# Patient Record
Sex: Male | Born: 1955 | Race: Black or African American | Hispanic: No | Marital: Married | Smoking: Never smoker
Health system: Southern US, Community
[De-identification: ages and names within clinical notes are randomized; demographics above are authoritative.]

## PROBLEM LIST (undated history)

## (undated) DIAGNOSIS — I219 Acute myocardial infarction, unspecified: Secondary | ICD-10-CM

## (undated) DIAGNOSIS — I10 Essential (primary) hypertension: Secondary | ICD-10-CM

## (undated) DIAGNOSIS — E119 Type 2 diabetes mellitus without complications: Secondary | ICD-10-CM

## (undated) HISTORY — PX: EYE SURGERY: SHX253

## (undated) HISTORY — PX: KIDNEY DONATION: SHX685

---

## 2016-03-26 ENCOUNTER — Encounter (HOSPITAL_COMMUNITY): Payer: Self-pay

## 2016-03-26 ENCOUNTER — Emergency Department (HOSPITAL_COMMUNITY)
Admission: EM | Admit: 2016-03-26 | Discharge: 2016-03-26 | Disposition: A | Discharge status: Home / Self Care | Payer: Managed Care, Other (non HMO) | Attending: Emergency Medicine | Admitting: Emergency Medicine

## 2016-03-26 ENCOUNTER — Emergency Department (HOSPITAL_COMMUNITY): Payer: Managed Care, Other (non HMO)

## 2016-03-26 DIAGNOSIS — R29810 Facial weakness: Secondary | ICD-10-CM | POA: Insufficient documentation

## 2016-03-26 DIAGNOSIS — E11649 Type 2 diabetes mellitus with hypoglycemia without coma: Secondary | ICD-10-CM | POA: Diagnosis not present

## 2016-03-26 DIAGNOSIS — Z794 Long term (current) use of insulin: Secondary | ICD-10-CM | POA: Insufficient documentation

## 2016-03-26 DIAGNOSIS — I10 Essential (primary) hypertension: Secondary | ICD-10-CM | POA: Insufficient documentation

## 2016-03-26 DIAGNOSIS — E162 Hypoglycemia, unspecified: Secondary | ICD-10-CM

## 2016-03-26 HISTORY — DX: Essential (primary) hypertension: I10

## 2016-03-26 HISTORY — DX: Type 2 diabetes mellitus without complications: E11.9

## 2016-03-26 HISTORY — DX: Acute myocardial infarction, unspecified: I21.9

## 2016-03-26 LAB — CBC WITH DIFFERENTIAL/PLATELET
Basophils Absolute: 0 10*3/uL (ref 0.0–0.1)
Basophils Relative: 0 %
EOS ABS: 0 10*3/uL (ref 0.0–0.7)
EOS PCT: 0 %
HCT: 37.6 % — ABNORMAL LOW (ref 39.0–52.0)
HEMOGLOBIN: 12.5 g/dL — AB (ref 13.0–17.0)
LYMPHS ABS: 0.9 10*3/uL (ref 0.7–4.0)
Lymphocytes Relative: 13 %
MCH: 30.8 pg (ref 26.0–34.0)
MCHC: 33.2 g/dL (ref 30.0–36.0)
MCV: 92.6 fL (ref 78.0–100.0)
MONOS PCT: 5 %
Monocytes Absolute: 0.3 10*3/uL (ref 0.1–1.0)
NEUTROS PCT: 82 %
Neutro Abs: 5.3 10*3/uL (ref 1.7–7.7)
Platelets: 175 10*3/uL (ref 150–400)
RBC: 4.06 MIL/uL — ABNORMAL LOW (ref 4.22–5.81)
RDW: 12.4 % (ref 11.5–15.5)
WBC: 6.5 10*3/uL (ref 4.0–10.5)

## 2016-03-26 LAB — COMPREHENSIVE METABOLIC PANEL
ALT: 30 U/L (ref 17–63)
AST: 25 U/L (ref 15–41)
Albumin: 3.1 g/dL — ABNORMAL LOW (ref 3.5–5.0)
Alkaline Phosphatase: 93 U/L (ref 38–126)
Anion gap: 4 — ABNORMAL LOW (ref 5–15)
BUN: 10 mg/dL (ref 6–20)
CO2: 26 mmol/L (ref 22–32)
CREATININE: 1.06 mg/dL (ref 0.61–1.24)
Calcium: 8.3 mg/dL — ABNORMAL LOW (ref 8.9–10.3)
Chloride: 108 mmol/L (ref 101–111)
GFR calc Af Amer: >60 mL/min (ref >60)
GFR calc non Af Amer: >60 mL/min (ref >60)
Glucose, Bld: 115 mg/dL — ABNORMAL HIGH (ref 65–99)
Potassium: 3.7 mmol/L (ref 3.5–5.1)
SODIUM: 138 mmol/L (ref 135–145)
Total Bilirubin: 0.6 mg/dL (ref 0.3–1.2)
Total Protein: 6.1 g/dL — ABNORMAL LOW (ref 6.5–8.1)

## 2016-03-26 LAB — URINALYSIS, ROUTINE W REFLEX MICROSCOPIC
BACTERIA UA: NONE SEEN
Bilirubin Urine: NEGATIVE
Hgb urine dipstick: NEGATIVE
Ketones, ur: NEGATIVE mg/dL
Leukocytes, UA: NEGATIVE
Nitrite: NEGATIVE
Protein, ur: NEGATIVE mg/dL
Specific Gravity, Urine: 1.016 (ref 1.005–1.030)
pH: 5 (ref 5.0–8.0)

## 2016-03-26 LAB — CBG MONITORING, ED
GLUCOSE-CAPILLARY: 89 mg/dL (ref 65–99)
GLUCOSE-CAPILLARY: 258 mg/dL — AB (ref 65–99)
GLUCOSE-CAPILLARY: 358 mg/dL — AB (ref 65–99)

## 2016-03-26 NOTE — ED Notes (Signed)
Pt stable, understands discharge instructions, and reasons for return.   

## 2016-03-26 NOTE — ED Notes (Signed)
Patient transported to X-ray 

## 2016-03-26 NOTE — ED Provider Notes (Signed)
[facility]-EMERGENCY DEPT Provider Note   CSN: [id number] Arrival date & time: 03/26/16 1928     History    Chief Complaint  Patient presents with  . Hypoglycemia  . Facial Droop     HPI Stephen Braun is a 60 y.o. male. 60yo M w/ IDDM, HTN who p/w hypoglycemia and facial droop. Per EMS, pt was found altered in his car at a gas station. He was found to have a blood glucose of 31 and given an amp of D50. He initially had left-sided facial droop. The patient currently denies any complaints. He notes that over the past 2 weeks after his endocrinologist increased his basal insulin, he has had frequent episodes of hypoglycemia. He denies any major changes in his diet. He denies any fevers, vomiting, diarrhea, chest pain, shortness of breath, extremity numbness/weakness, or recent illness. Currently he feels fine.  Past Medical History:  Diagnosis Date  . Diabetes mellitus without complication (HCC)   . Heart attack   . Hypertension      There are no active problems to display for this patient.   Past Surgical History:  Procedure Laterality Date  . EYE SURGERY    . KIDNEY DONATION          Home Medications    Prior to Admission medications   Medication Sig Start Date End Date Taking? Authorizing Provider  Insulin Human (INSULIN PUMP) SOLN Inject into the skin See admin instructions. Novolog between 9-10 units daily   Yes Historical Provider, MD      History reviewed. No pertinent family history.   Social History  Substance Use Topics  . Smoking status: Never Smoker  . Smokeless tobacco: Never Used  . Alcohol use No     Allergies     Patient has no known allergies.    Review of Systems  10 Systems reviewed and are negative for acute change except as noted in the HPI.   Physical Exam Updated Vital Signs BP 166/98   Pulse 90   Temp 98.7 F (37.1 C) (Oral)   Resp 26   Ht 5\' 5"  (1.651 m)   Wt 173 lb (78.5 kg)   SpO2 100%   BMI 28.79 kg/m   Physical  Exam  Constitutional: He is oriented to person, place, and time. He appears well-developed and well-nourished. No distress.  Awake, alert  HENT:  Head: Normocephalic and atraumatic.  Eyes: Conjunctivae and EOM are normal. Pupils are equal, round, and reactive to light.  Neck: Neck supple.  Cardiovascular: Normal rate, regular rhythm and normal heart sounds.   No murmur heard. Pulmonary/Chest: Effort normal and breath sounds normal. No respiratory distress.  Abdominal: Soft. Bowel sounds are normal. He exhibits no distension. There is no tenderness.  Insulin pupm in LLQ no surrounding erythema   Musculoskeletal: He exhibits no edema.  Neurological: He is alert and oriented to person, place, and time. He has normal reflexes. No cranial nerve deficit. He exhibits normal muscle tone.  Fluent speech, normal finger-to-nose testing 5/5 strength and normal sensation x all 4 extremities  Skin: Skin is warm and dry.  Psychiatric: He has a normal mood and affect. Judgment and thought content normal.  Nursing note and vitals reviewed.     ED Treatments / Results  Labs (all labs ordered are listed, but only abnormal results are displayed) Labs Reviewed  COMPREHENSIVE METABOLIC PANEL - Abnormal; Notable for the following:       Result Value   Glucose, Bld 115 (*)  Calcium 8.3 (*)    Total Protein 6.1 (*)    Albumin 3.1 (*)    Anion gap 4 (*)    All other components within normal limits  CBC WITH DIFFERENTIAL/PLATELET - Abnormal; Notable for the following:    RBC 4.06 (*)    Hemoglobin 12.5 (*)    HCT 37.6 (*)    All other components within normal limits  URINALYSIS, ROUTINE W REFLEX MICROSCOPIC - Abnormal; Notable for the following:    Glucose, UA >=500 (*)    Squamous Epithelial / LPF 0-5 (*)    All other components within normal limits  CBG MONITORING, ED - Abnormal; Notable for the following:    Glucose-Capillary 258 (*)    All other components within normal limits  CBG  MONITORING, ED - Abnormal; Notable for the following:    Glucose-Capillary 358 (*)    All other components within normal limits  CBG MONITORING, ED  CBG MONITORING, ED  CBG MONITORING, ED     EKG  EKG Interpretation  Date/Time:  Thursday March 26 2016 19:33:03 EST Ventricular Rate:  81 PR Interval:    QRS Duration: 82 QT Interval:  370 QTC Calculation: 430 R Axis:   44 Text Interpretation:  Sinus rhythm Borderline ST elevation, anterior leads No previous ECGs available Confirmed by LITTLE MD, RACHEL [phone]) on 03/26/2016 7:45:21 PM         Radiology Dg Chest 2 View  Result Date: 03/26/2016 CLINICAL DATA:  60 year old male with recurrent hypoglycemia and cough. EXAM: CHEST  2 VIEW COMPARISON:  None. FINDINGS: The heart size and mediastinal contours are within normal limits. Both lungs are clear. The visualized skeletal structures are unremarkable. IMPRESSION: No active cardiopulmonary disease. Electronically Signed   By: Elgie Collard M.D.   On: 03/26/2016 21:13    Procedures Procedures (including critical care time) Procedures  Medications Ordered in ED  Medications - No data to display   Initial Impression / Assessment and Plan / ED Course  I have reviewed the triage vital signs and the nursing notes.  Pertinent labs & imaging results that were available during my care of the patient were reviewed by me and considered in my medical decision making (see chart for details).  Clinical Course    Patient with episode of altered mental status and left facial droop according to EMS, noted to have severe hypoglycemia and given D50 prior to arrival. On my examination he was awake and alert, oriented and comfortable. Vital signs stable. Normal neurologic exam, no evidence of facial droop on my examination. I suspect his initial neurologic symptoms were related to severe hypoglycemia. His repeat BG here was 89. Gave juice and a sandwich and obtained above lab work. Lab  work reassuring without any evidence of infection. Chest x-ray also negative. The patient was observed for several hours during which he remained stable. The patient follows at the [facility] for his endocrinology care. He does not know how to adjust his basal insulin but he is able to adjust how much bolused insulin he gives himself. I recommended that he gives himself half of the bolus normally recommended by his insulin pump. He will also maintain a good diet as he states that he had skipped lunch today. He will contact his endocrinologist first thing in the morning to discuss his basal insulin regimen. I extensively reviewed return precautions. Patient voiced understanding and felt comfortable at time of discharge. Patient discharged in satisfactory condition.  Final Clinical Impressions(s) / ED Diagnoses  Final diagnoses:  Hypoglycemia     Discharge Medication List as of 03/26/2016 11:17 PM         Laurence Spatesachel Morgan Little, MD 03/26/16 [phone]

## 2016-03-26 NOTE — Discharge Instructions (Signed)
GIVE YOURSELF HALF OF YOUR NORMAL DOSE OF INSULIN BOLUS UNTIL YOU TALK TO YOUR ENDOCRINOLOGIST TOMORROW. IT IS OK FOR YOUR BLOOD SUGAR TO RUN HIGHER 200-300 UNTIL YOU TALK TO THE ENDOCRINOLOGIST.

## 2016-03-26 NOTE — ED Notes (Signed)
CBG is 89. 

## 2016-03-26 NOTE — ED Triage Notes (Signed)
Pt brought in [facility]. Pt found in car lethargic. Pt BG was 31 at scene. Pt given 1 amp of D50 on transport. Pt presenting with left sided facial droop. Pt is alert and with no other deficits. Pt last scene normal before 1pm. Pt spoke with wife at 1pm and pt wife stated he sounded "different".

## 2017-12-07 IMAGING — DX DG CHEST 2V
2 series · 2 of 2 positions shown · non-contrast
Comparison: None.

CLINICAL DATA: 60-year-old male with recurrent hypoglycemia and
cough.

EXAM:
CHEST  2 VIEW

[chest pa]
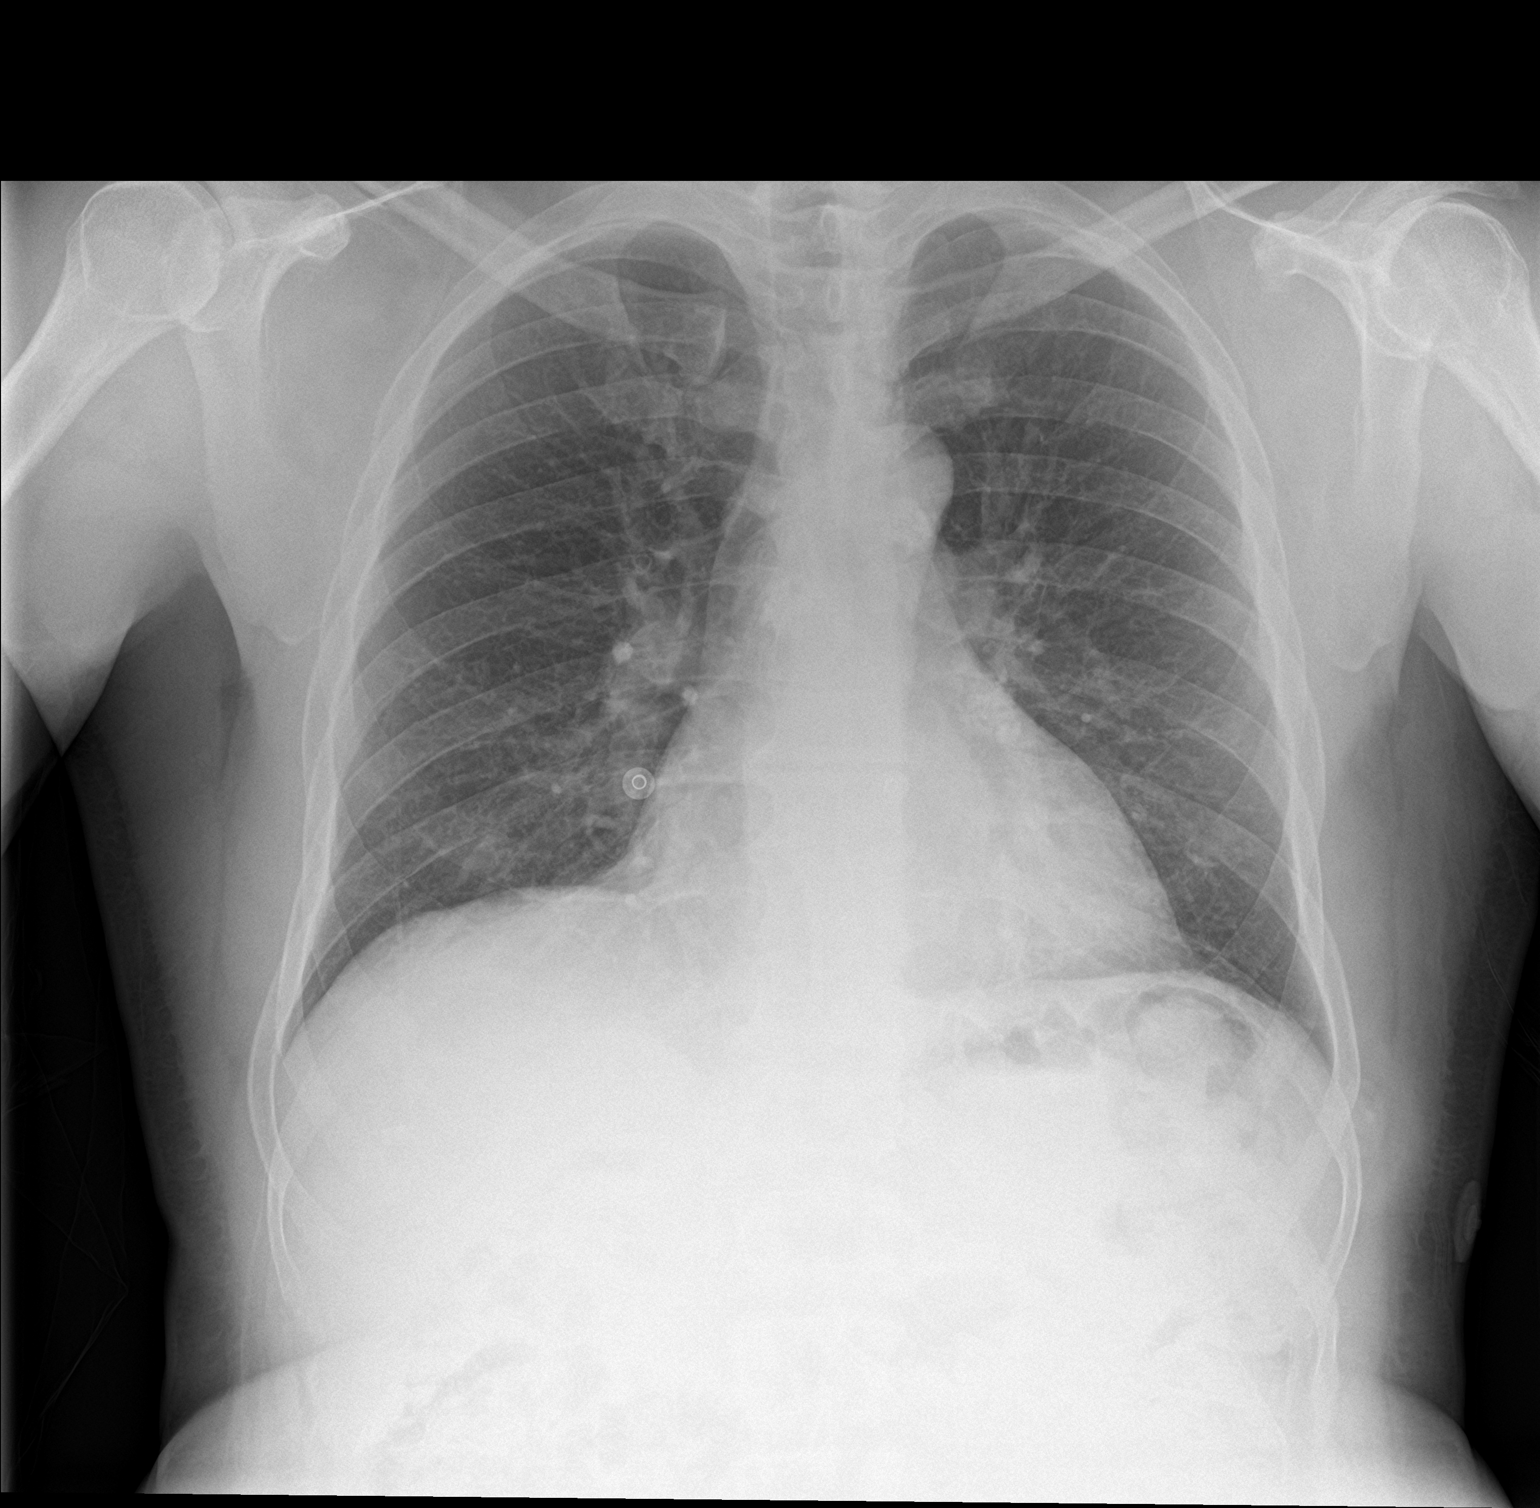

[chest lat]
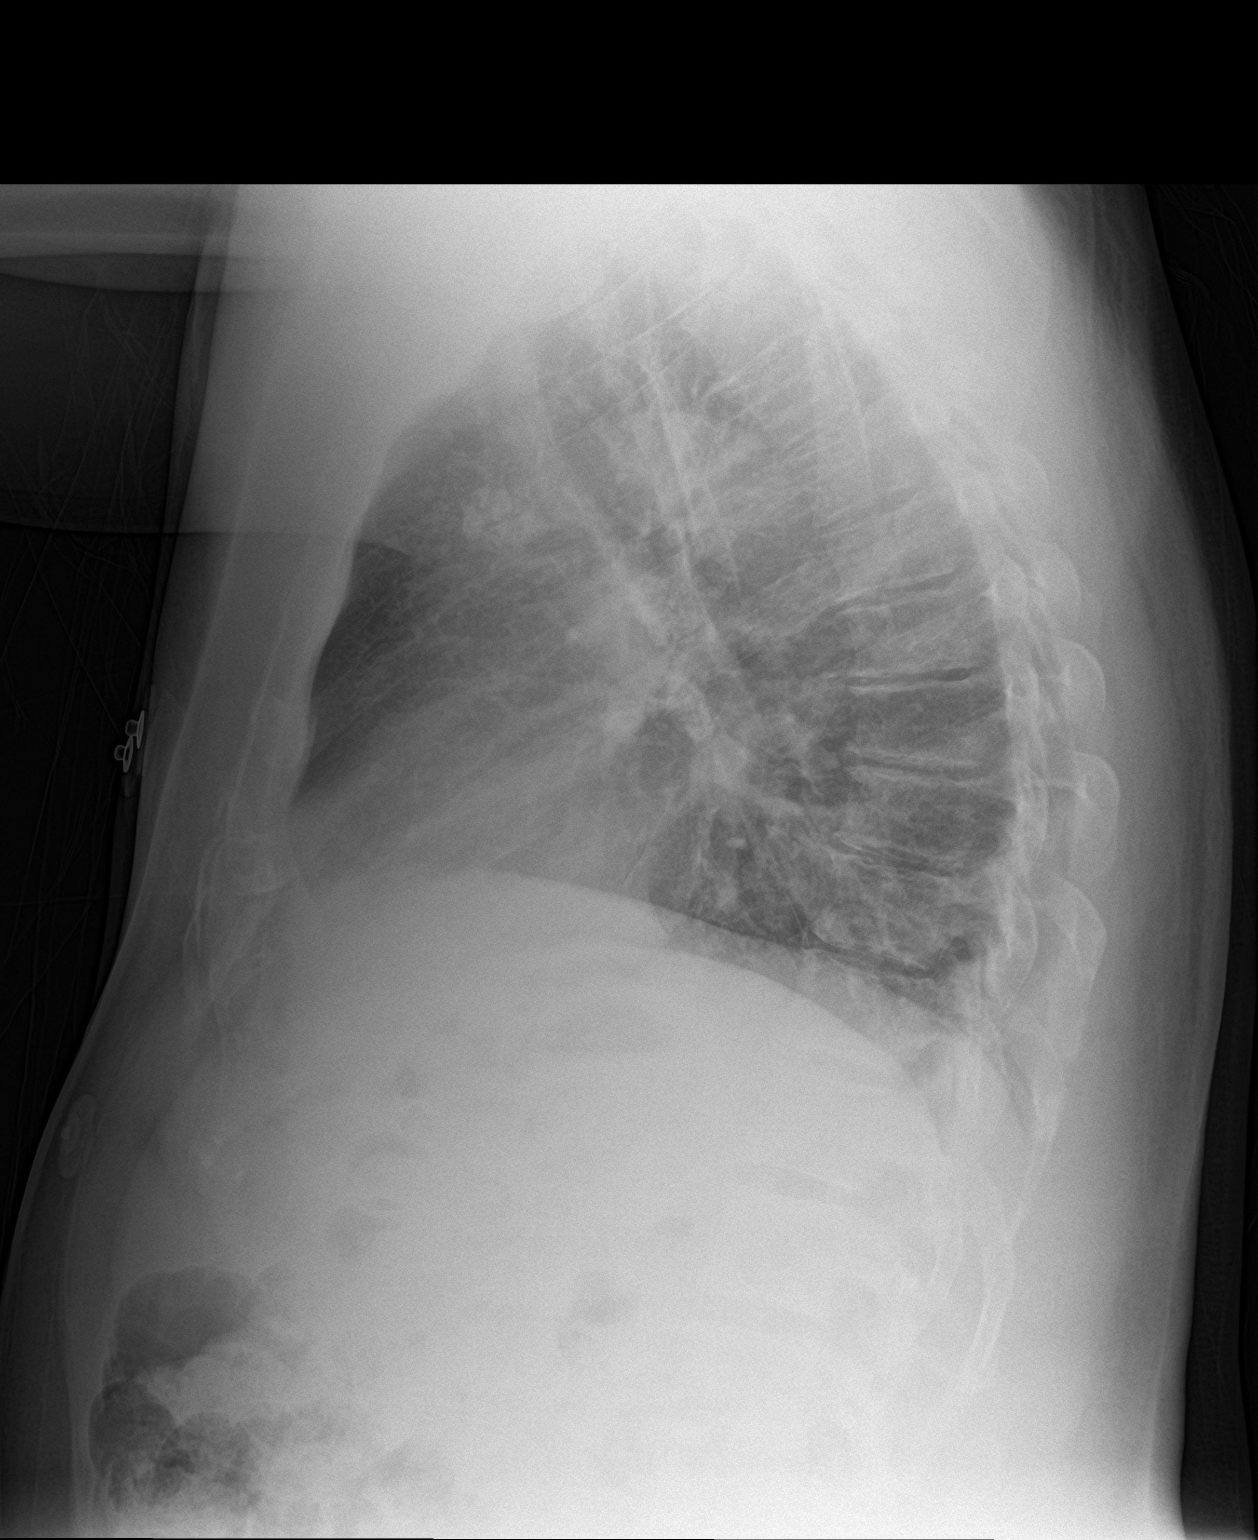

[2 of 2 positions shown; findings below may reference images not displayed]

FINDINGS: The heart size and mediastinal contours are within normal limits.
Both lungs are clear. The visualized skeletal structures are
unremarkable.
IMPRESSION: No active cardiopulmonary disease.

## 2020-05-14 DEATH — deceased
